# Patient Record
Sex: Female | Born: 1990 | Race: White | Hispanic: No | Marital: Married | Smoking: Former smoker
Health system: Southern US, Community
[De-identification: ages and names within clinical notes are randomized; demographics above are authoritative.]

---
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
## 2014-10-03 HISTORY — PX: TUBAL LIGATION: SHX77

## 2019-10-08 DIAGNOSIS — M542 Cervicalgia: Secondary | ICD-10-CM

## 2019-10-08 DIAGNOSIS — F329 Major depressive disorder, single episode, unspecified: Secondary | ICD-10-CM

## 2019-10-08 DIAGNOSIS — G43109 Migraine with aura, not intractable, without status migrainosus: Secondary | ICD-10-CM

## 2019-10-08 HISTORY — DX: Major depressive disorder, single episode, unspecified: F32.9

## 2019-10-08 HISTORY — DX: Cervicalgia: M54.2

## 2019-10-08 HISTORY — DX: Migraine with aura, not intractable, without status migrainosus: G43.109

## 2019-11-05 ENCOUNTER — Encounter: Payer: Self-pay | Admitting: Nurse Practitioner

## 2019-11-05 ENCOUNTER — Ambulatory Visit: Payer: 59 | Admitting: Nurse Practitioner

## 2019-11-05 ENCOUNTER — Other Ambulatory Visit: Payer: Self-pay

## 2019-11-05 DIAGNOSIS — G43009 Migraine without aura, not intractable, without status migrainosus: Secondary | ICD-10-CM | POA: Diagnosis not present

## 2019-11-05 DIAGNOSIS — F339 Major depressive disorder, recurrent, unspecified: Secondary | ICD-10-CM

## 2019-11-05 DIAGNOSIS — Z0000 Encounter for general adult medical examination without abnormal findings: Secondary | ICD-10-CM | POA: Diagnosis not present

## 2019-11-05 MED ORDER — CITALOPRAM HYDROBROMIDE 20 MG PO TABS: 20.0000 mg | ORAL_TABLET | Freq: Every day | ORAL | 0 refills | Status: DC

## 2019-11-05 MED ORDER — CITALOPRAM HYDROBROMIDE 20 MG PO TABS: 20.0000 mg | ORAL_TABLET | Freq: Every day | ORAL | 3 refills | Status: DC

## 2019-11-05 NOTE — Patient Instructions (Addendum)
Patient is to return in 3 month for f/u for increase in citalopram   Preventive Care 7-29 Years Old, Female Preventive care refers to visits with your health care provider and lifestyle choices that can promote health and wellness. This includes:  A yearly physical exam. This may also be called an annual well check.  Regular dental visits and eye exams.  Immunizations.  Screening for certain conditions.  Healthy lifestyle choices, such as eating a healthy diet, getting regular exercise, not using drugs or products that contain nicotine and tobacco, and limiting alcohol use. What can I expect for my preventive care visit? Physical exam Your health care provider will check your:  Height and weight. This may be used to calculate body mass index (BMI), which tells if you are at a healthy weight.  Heart rate and blood pressure.  Skin for abnormal spots. Counseling Your health care provider may ask you questions about your:  Alcohol, tobacco, and drug use.  Emotional well-being.  Home and relationship well-being.  Sexual activity.  Eating habits.  Work and work Statistician.  Method of birth control.  Menstrual cycle.  Pregnancy history. What immunizations do I need?  Influenza (flu) vaccine  This is recommended every year. Tetanus, diphtheria, and pertussis (Tdap) vaccine  You may need a Td booster every 10 years. Varicella (chickenpox) vaccine  You may need this if you have not been vaccinated. Human papillomavirus (HPV) vaccine  If recommended by your health care provider, you may need three doses over 6 months. Measles, mumps, and rubella (MMR) vaccine  You may need at least one dose of MMR. You may also need a second dose. Meningococcal conjugate (MenACWY) vaccine  One dose is recommended if you are age 76-21 years and a first-year college student living in a residence hall, or if you have one of several medical conditions. You may also need additional  booster doses. Pneumococcal conjugate (PCV13) vaccine  You may need this if you have certain conditions and were not previously vaccinated. Pneumococcal polysaccharide (PPSV23) vaccine  You may need one or two doses if you smoke cigarettes or if you have certain conditions. Hepatitis A vaccine  You may need this if you have certain conditions or if you travel or work in places where you may be exposed to hepatitis A. Hepatitis B vaccine  You may need this if you have certain conditions or if you travel or work in places where you may be exposed to hepatitis B. Haemophilus influenzae type b (Hib) vaccine  You may need this if you have certain conditions. You may receive vaccines as individual doses or as more than one vaccine together in one shot (combination vaccines). Talk with your health care provider about the risks and benefits of combination vaccines. What tests do I need?  Blood tests  Lipid and cholesterol levels. These may be checked every 5 years starting at age 90.  Hepatitis C test.  Hepatitis B test. Screening  Diabetes screening. This is done by checking your blood sugar (glucose) after you have not eaten for a while (fasting).  Sexually transmitted disease (STD) testing.  BRCA-related cancer screening. This may be done if you have a family history of breast, ovarian, tubal, or peritoneal cancers.  Pelvic exam and Pap test. This may be done every 3 years starting at age 26. Starting at age 76, this may be done every 5 years if you have a Pap test in combination with an HPV test. Talk with your health care  provider about your test results, treatment options, and if necessary, the need for more tests. Follow these instructions at home: Eating and drinking   Eat a diet that includes fresh fruits and vegetables, whole grains, lean protein, and low-fat dairy.  Take vitamin and mineral supplements as recommended by your health care provider.  Do not drink alcohol  if: ? Your health care provider tells you not to drink. ? You are pregnant, may be pregnant, or are planning to become pregnant.  If you drink alcohol: ? Limit how much you have to 0-1 drink a day. ? Be aware of how much alcohol is in your drink. In the [place] one drink equals one 12 oz bottle of beer (355 mL), one 5 oz glass of wine (148 mL), or one 1 oz glass of hard liquor (44 mL). Lifestyle  Take daily care of your teeth and gums.  Stay active. Exercise for at least 30 minutes on 5 or more days each week.  Do not use any products that contain nicotine or tobacco, such as cigarettes, e-cigarettes, and chewing tobacco. If you need help quitting, ask your health care provider.  If you are sexually active, practice safe sex. Use a condom or other form of birth control (contraception) in order to prevent pregnancy and STIs (sexually transmitted infections). If you plan to become pregnant, see your health care provider for a preconception visit. What's next?  Visit your health care provider once a year for a well check visit.  Ask your health care provider how often you should have your eyes and teeth checked.  Stay up to date on all vaccines. This information is not intended to replace advice given to you by your health care provider. Make sure you discuss any questions you have with your health care provider. Document Revised: 05/31/2018 Document Reviewed: 05/31/2018 Elsevier Patient Education  2020 [employer].

## 2019-11-06 ENCOUNTER — Other Ambulatory Visit (INDEPENDENT_AMBULATORY_CARE_PROVIDER_SITE_OTHER): Payer: 59 | Admitting: Nurse Practitioner

## 2019-11-06 ENCOUNTER — Telehealth: Payer: Self-pay

## 2019-11-06 DIAGNOSIS — D229 Melanocytic nevi, unspecified: Secondary | ICD-10-CM

## 2019-11-06 LAB — CBC WITH DIFFERENTIAL/PLATELET
Basophils Absolute: 0.1 10*3/uL (ref 0.0–0.2)
Basos: 1 %
EOS (ABSOLUTE): 0.1 10*3/uL (ref 0.0–0.4)
Eos: 1 %
Hematocrit: 38.2 % (ref 34.0–46.6)
Hemoglobin: 12.9 g/dL (ref 11.1–15.9)
Immature Grans (Abs): 0 10*3/uL (ref 0.0–0.1)
Immature Granulocytes: 0 %
Lymphocytes Absolute: 2.6 10*3/uL (ref 0.7–3.1)
Lymphs: 28 %
MCH: 28.5 pg (ref 26.6–33.0)
MCHC: 33.8 g/dL (ref 31.5–35.7)
MCV: 85 fL (ref 79–97)
Monocytes Absolute: 0.5 10*3/uL (ref 0.1–0.9)
Monocytes: 5 %
Neutrophils Absolute: 6.1 10*3/uL (ref 1.4–7.0)
Neutrophils: 65 %
Platelets: 262 10*3/uL (ref 150–450)
RBC: 4.52 x10E6/uL (ref 3.77–5.28)
RDW: 12.7 % (ref 11.7–15.4)
WBC: 9.4 10*3/uL (ref 3.4–10.8)

## 2019-11-06 LAB — COMPREHENSIVE METABOLIC PANEL
ALT: 15 IU/L (ref 0–32)
AST: 20 IU/L (ref 0–40)
Albumin/Globulin Ratio: 1.6 (ref 1.2–2.2)
Albumin: 4.7 g/dL (ref 3.9–5.0)
Alkaline Phosphatase: 76 IU/L (ref 39–117)
BUN/Creatinine Ratio: 14 (ref 9–23)
BUN: 13 mg/dL (ref 6–20)
Bilirubin Total: 0.4 mg/dL (ref 0.0–1.2)
CO2: 21 mmol/L (ref 20–29)
Calcium: 9.7 mg/dL (ref 8.7–10.2)
Chloride: 108 mmol/L — ABNORMAL HIGH (ref 96–106)
Creatinine, Ser: 0.96 mg/dL (ref 0.57–1.00)
GFR calc Af Amer: 93 mL/min/{1.73_m2} (ref >59)
GFR calc non Af Amer: 81 mL/min/{1.73_m2} (ref >59)
Globulin, Total: 2.9 g/dL (ref 1.5–4.5)
Glucose: 86 mg/dL (ref 65–99)
Potassium: 4.2 mmol/L (ref 3.5–5.2)
Sodium: 142 mmol/L (ref 134–144)
Total Protein: 7.6 g/dL (ref 6.0–8.5)

## 2019-11-06 LAB — LIPID PANEL
Chol/HDL Ratio: 4.9 ratio — ABNORMAL HIGH (ref 0.0–4.4)
Cholesterol, Total: 228 mg/dL — ABNORMAL HIGH (ref 100–199)
HDL: 47 mg/dL (ref >39)
LDL Chol Calc (NIH): 162 mg/dL — ABNORMAL HIGH (ref 0–99)
Triglycerides: 107 mg/dL (ref 0–149)
VLDL Cholesterol Cal: 19 mg/dL (ref 5–40)

## 2019-11-06 LAB — CARDIOVASCULAR RISK ASSESSMENT

## 2019-11-06 NOTE — Progress Notes (Deleted)
Referral to dermatology.  

## 2019-11-06 NOTE — Progress Notes (Addendum)
Established Patient Office Visit  Subjective:  Patient ID: Melanie Walter, female    DOB: Oct 01, 1991  Age: 29 y.o. MRN: [id number]  CC:  Chief Complaint  Patient presents with  . Annual Exam    HPI Melanie Walter presents for an encounter for general adult medical examination without abnormal findings. Her last physical exam was 2016 she has never had formal vision screening and reports no changes in vision. She performs self breast exams occasionally. SHe is current with her immunization. Tdap, refuse influenza,    Smoking Sts: occasional smoker (Marijuana) used for jaw pain per patient after wisdom tooth extraction. .   Encounter for general adult medical examination without abnormal findings  Physical: Patient's last physical exam was 2016 Weight: Appropriate for height (BMI less than 27%) ;  Blood Pressure: Normal (BP less than 130/80) ;  Medical History: Patient history reviewed ; Family history reviewed ;  Allergies Reviewed: No change in current allergies ;  Medications Reviewed: Medications reviewed - no changes ;  Lipids: abnormal lipid levels ;  Smoking: Life-long non-smoker ; Marijuana  Physical Activity: Exercises at least 2-3 times per week ;  Alcohol/Drug Use: 1-2 drinks per week ; Ma Patient is not afflicted from Stress Incontinence and Urge Incontinence  Safety: reviewed ; Patient wears a seat belt, has smoke detectors, has carbon monoxide detectors and wears sunscreen with extended sun exposure. Dental Care: biannual cleanings, brushes and flosses daily. Ophthalmology/Optometry: Annual visit.  Hearing loss: none Vision impairments: none  Past Medical History:  Diagnosis Date  . Cervicalgia 10/08/2019  . Major depression, single episode 10/08/2019  . Migraine with aura and without status migrainosus, not intractable 10/08/2019    Past Surgical History:  Procedure Laterality Date  . TUBAL LIGATION Bilateral 2016    Family History  Problem Relation Age  of Onset  . Heart failure Mother   . Hypertension Mother   . Asthma Mother   . COPD Mother   . Transient ischemic attack Mother   . Diabetes Mellitus II Mother   . Hypertension Father   . Diabetes Mellitus II Father   . Heart attack Father     Social History   Socioeconomic History  . Marital status: Married    Spouse name: Not on file  . Number of children: 3  . Years of education: Not on file  . Highest education level: Not on file  Occupational History  . Occupation: Un-Employed  Tobacco Use  . Smoking status: Former Smoker    Quit date: 06/2019    Years since quitting: 0.5  . Smokeless tobacco: Never Used  Substance and Sexual Activity  . Alcohol use: Yes    Comment: 1-2 mixed drinks a week   . Drug use: Never  . Sexual activity: Not on file  Other Topics Concern  . Not on file  Social History Narrative  . Not on file   Social Determinants of Health   Financial Resource Strain:   . Difficulty of Paying Living Expenses: Not on file  Food Insecurity:   . Worried About Charity fundraiser in the Last Year: Not on file  . Ran Out of Food in the Last Year: Not on file  Transportation Needs:   . Lack of Transportation (Medical): Not on file  . Lack of Transportation (Non-Medical): Not on file  Physical Activity:   . Days of Exercise per Week: Not on file  . Minutes of Exercise per Session: Not on file  Stress:   .  Feeling of Stress : Not on file  Social Connections:   . Frequency of Communication with Friends and Family: Not on file  . Frequency of Social Gatherings with Friends and Family: Not on file  . Attends Religious Services: Not on file  . Active Member of Clubs or Organizations: Not on file  . Attends Archivist Meetings: Not on file  . Marital Status: Not on file  Intimate Partner Violence:   . Fear of Current or Ex-Partner: Not on file  . Emotionally Abused: Not on file  . Physically Abused: Not on file  . Sexually Abused: Not on file     Outpatient Medications Prior to Visit  Medication Sig Dispense Refill  . topiramate (TOPAMAX) 25 MG tablet Take 25 mg by mouth 2 (two) times daily. Take 1 tablet for first week then 2nd week take 2 tablets to be at maintenance dose.    . citalopram (CELEXA) 10 MG tablet Take 10 mg by mouth daily. Take 1 tablet daily     No facility-administered medications prior to visit.    Allergies  Allergen Reactions  . Miconazole     ROS Review of Systems  Constitutional: Negative for activity change, appetite change and fatigue.  HENT: Negative for congestion, ear discharge, ear pain, facial swelling, hearing loss, mouth sores, nosebleeds, sinus pain and voice change.   Eyes: Negative for pain and discharge.  Respiratory: Negative for cough and shortness of breath.   Cardiovascular: Negative for chest pain, palpitations and leg swelling.  Gastrointestinal: Negative for abdominal distention, abdominal pain, constipation and nausea.  Endocrine: Negative for cold intolerance and heat intolerance.  Genitourinary: Negative for difficulty urinating.  Neurological: Negative for facial asymmetry and weakness.  Psychiatric/Behavioral: Negative for agitation. The patient is not nervous/anxious.        Objective:    Physical Exam  Constitutional: She appears well-developed. No distress.  HENT:  Head: Normocephalic.  Right Ear: External ear normal.  Left Ear: External ear normal.  Eyes: Pupils are equal, round, and reactive to light. Conjunctivae and EOM are normal. Right eye exhibits no discharge.  Cardiovascular: Regular rhythm, normal heart sounds and intact distal pulses.  Pulmonary/Chest: Effort normal. She exhibits no tenderness.  Abdominal: Soft. Bowel sounds are normal. She exhibits no distension. There is no abdominal tenderness.  Genitourinary:    Vagina normal.   Musculoskeletal:        General: No edema. Normal range of motion.     Cervical back: Normal range of motion and  neck supple.  Neurological: She is alert.  Skin: No rash noted.  Psychiatric: She has a normal mood and affect.       There were no vitals taken for this visit. Wt Readings from Last 3 Encounters:  10/16/19 168 lb (76.2 kg)     Health Maintenance Due  Topic Date Due  . HIV Screening  03/04/2006  . TETANUS/TDAP  03/04/2010  . PAP-Cervical Cytology Screening  03/04/2012  . INFLUENZA VACCINE  05/04/2019    There are no preventive care reminders to display for this patient.  No results found for: TSH Lab Results  Component Value Date   WBC 9.4 11/05/2019   HGB 12.9 11/05/2019   HCT 38.2 11/05/2019   MCV 85 11/05/2019   PLT 262 11/05/2019   Lab Results  Component Value Date   NA 142 11/05/2019   K 4.2 11/05/2019   CO2 21 11/05/2019   GLUCOSE 86 11/05/2019   BUN 13 11/05/2019  CREATININE 0.96 11/05/2019   BILITOT 0.4 11/05/2019   ALKPHOS 76 11/05/2019   AST 20 11/05/2019   ALT 15 11/05/2019   PROT 7.6 11/05/2019   ALBUMIN 4.7 11/05/2019   CALCIUM 9.7 11/05/2019   Lab Results  Component Value Date   CHOL 228 (H) 11/05/2019   Lab Results  Component Value Date   HDL 47 11/05/2019   Lab Results  Component Value Date   LDLCALC 162 (H) 11/05/2019   Lab Results  Component Value Date   TRIG 107 11/05/2019   Lab Results  Component Value Date   CHOLHDL 4.9 (H) 11/05/2019   No results found for: HGBA1C    Assessment & Plan:   Problem List Items Addressed This Visit      Cardiovascular and Mediastinum   Migraine without aura and without status migrainosus, not intractable    Well controlled.  No changes to medicines.  Continue to work on eating a healthy diet and exercise.       Relevant Medications   citalopram (CELEXA) 20 MG tablet     Other   Depression, recurrent ([place])    Not well controlled.   changes to medication   Continue to work on eating a healthy diet and exercise.  Labs drawn today.       Relevant Medications   citalopram  (CELEXA) 20 MG tablet   Annual physical exam - Primary    Physical exam completed,   Continue to work on eating a healthy diet and exercise.  Labs drawn today.       Relevant Orders   CBC with Differential/Platelet (Completed)   Comprehensive metabolic panel (Completed)   Lipid Panel (Completed)   PapIG, HPV, rfx 16/18   Cardiovascular Risk Assessment (Completed)   Gyn Report (Completed)   Specimen status report (Completed)      Meds ordered this encounter  Medications  . DISCONTD: citalopram (CELEXA) 20 MG tablet    Sig: Take 1 tablet (20 mg total) by mouth daily.    Dispense:  30 tablet    Refill:  3    Order Specific Question:   Supervising Provider    AnswerRochel Brome [id number]  . citalopram (CELEXA) 20 MG tablet    Sig: Take 1 tablet (20 mg total) by mouth daily.    Dispense:  30 tablet    Refill:  0    Order Specific Question:   Supervising Provider    AnswerShelton Silvas    Follow-up: Return in about 3 months (around 02/02/2020).    Ivy Lynn, NP

## 2019-11-06 NOTE — Telephone Encounter (Signed)
Patient was notified.

## 2019-11-08 LAB — GYN REPORT: PAP Smear Comment: 0

## 2019-11-08 LAB — SPECIMEN STATUS REPORT

## 2019-11-08 NOTE — Progress Notes (Signed)
Need to repeat PAP no charge, CBC all normal, kidney nd liver tests normal, LDL cholesterol is high and needs to start a low cholesterol diet- can send, lp

## 2019-12-03 ENCOUNTER — Ambulatory Visit: Payer: 59 | Admitting: Nurse Practitioner

## 2019-12-08 ENCOUNTER — Encounter: Payer: Self-pay | Admitting: Nurse Practitioner

## 2019-12-08 DIAGNOSIS — G43009 Migraine without aura, not intractable, without status migrainosus: Secondary | ICD-10-CM | POA: Insufficient documentation

## 2019-12-08 DIAGNOSIS — Z0000 Encounter for general adult medical examination without abnormal findings: Secondary | ICD-10-CM | POA: Insufficient documentation

## 2019-12-08 DIAGNOSIS — F339 Major depressive disorder, recurrent, unspecified: Secondary | ICD-10-CM | POA: Insufficient documentation

## 2019-12-08 NOTE — Assessment & Plan Note (Signed)
Well controlled. No changes to medicines.  Continue to work on eating a healthy diet and exercise.    

## 2019-12-08 NOTE — Assessment & Plan Note (Signed)
Physical exam completed,   Continue to work on eating a healthy diet and exercise.  Labs drawn today.

## 2019-12-08 NOTE — Assessment & Plan Note (Signed)
Not well controlled.   changes to medication   Continue to work on eating a healthy diet and exercise.  Labs drawn today.

## 2020-01-01 DIAGNOSIS — D229 Melanocytic nevi, unspecified: Secondary | ICD-10-CM | POA: Insufficient documentation

## 2020-01-21 NOTE — Progress Notes (Signed)
No charge for referral.

## 2020-07-07 ENCOUNTER — Other Ambulatory Visit (HOSPITAL_COMMUNITY): Payer: Self-pay | Admitting: Internal Medicine

## 2020-07-07 DIAGNOSIS — Z1231 Encounter for screening mammogram for malignant neoplasm of breast: Secondary | ICD-10-CM

## 2020-07-15 ENCOUNTER — Ambulatory Visit (HOSPITAL_COMMUNITY)
Admission: RE | Admit: 2020-07-15 | Discharge: 2020-07-15 | Disposition: A | Discharge status: Home / Self Care | Payer: 59 | Source: Ambulatory Visit | Attending: Internal Medicine | Admitting: Internal Medicine

## 2020-07-15 ENCOUNTER — Other Ambulatory Visit: Payer: Self-pay

## 2020-07-15 ENCOUNTER — Encounter (HOSPITAL_COMMUNITY): Payer: Self-pay

## 2020-07-15 DIAGNOSIS — Z1231 Encounter for screening mammogram for malignant neoplasm of breast: Secondary | ICD-10-CM | POA: Insufficient documentation

## 2020-07-20 ENCOUNTER — Other Ambulatory Visit (HOSPITAL_COMMUNITY): Payer: Self-pay | Admitting: Internal Medicine

## 2020-07-20 DIAGNOSIS — R928 Other abnormal and inconclusive findings on diagnostic imaging of breast: Secondary | ICD-10-CM

## 2020-07-28 ENCOUNTER — Other Ambulatory Visit: Payer: Self-pay

## 2020-07-28 ENCOUNTER — Ambulatory Visit (HOSPITAL_COMMUNITY)
Admission: RE | Admit: 2020-07-28 | Discharge: 2020-07-28 | Disposition: A | Discharge status: Home / Self Care | Payer: 59 | Source: Ambulatory Visit | Attending: Internal Medicine | Admitting: Internal Medicine

## 2020-07-28 DIAGNOSIS — R928 Other abnormal and inconclusive findings on diagnostic imaging of breast: Secondary | ICD-10-CM | POA: Diagnosis not present

## 2022-05-08 IMAGING — MG DIGITAL SCREENING BILAT W/ TOMO W/ CAD
8 series · 9 of 24 positions shown · non-contrast
Comparison: None.

CLINICAL DATA: Screening.

EXAM:
DIGITAL SCREENING BILATERAL MAMMOGRAM WITH TOMO AND CAD

[L MLO synth-2D]
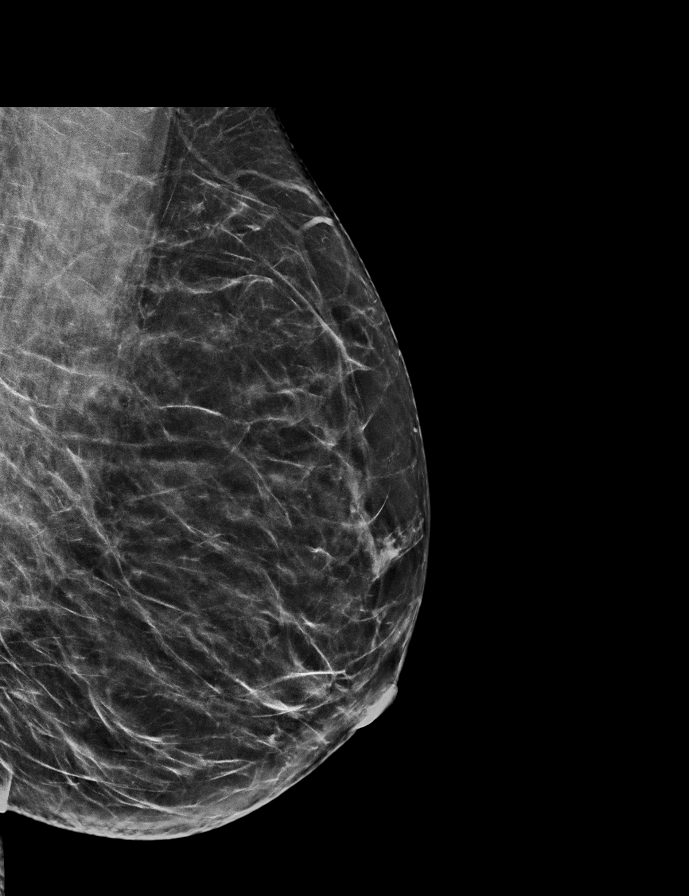

[R CC synth-2D]
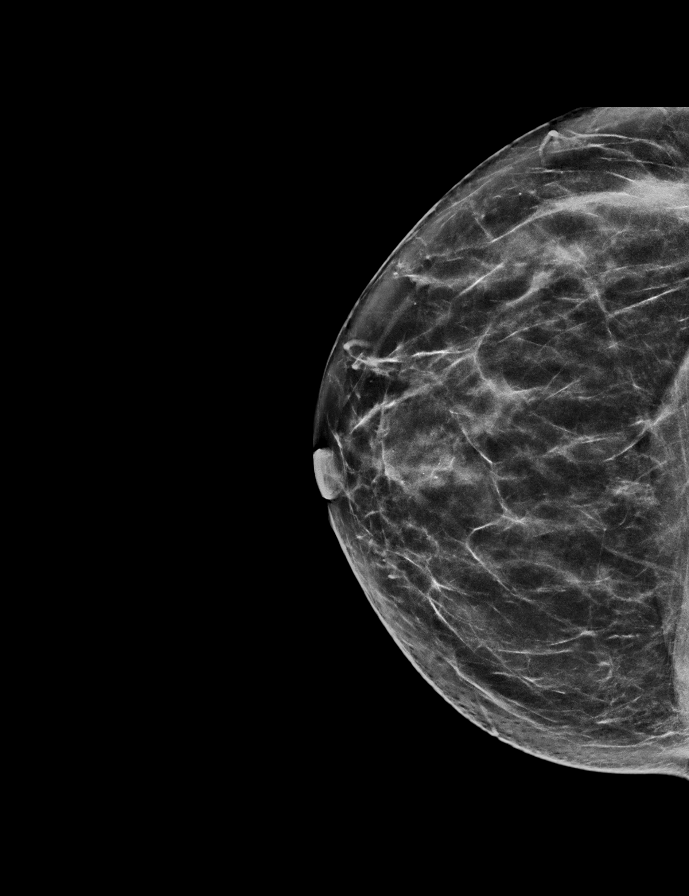

[L CC synth-2D]
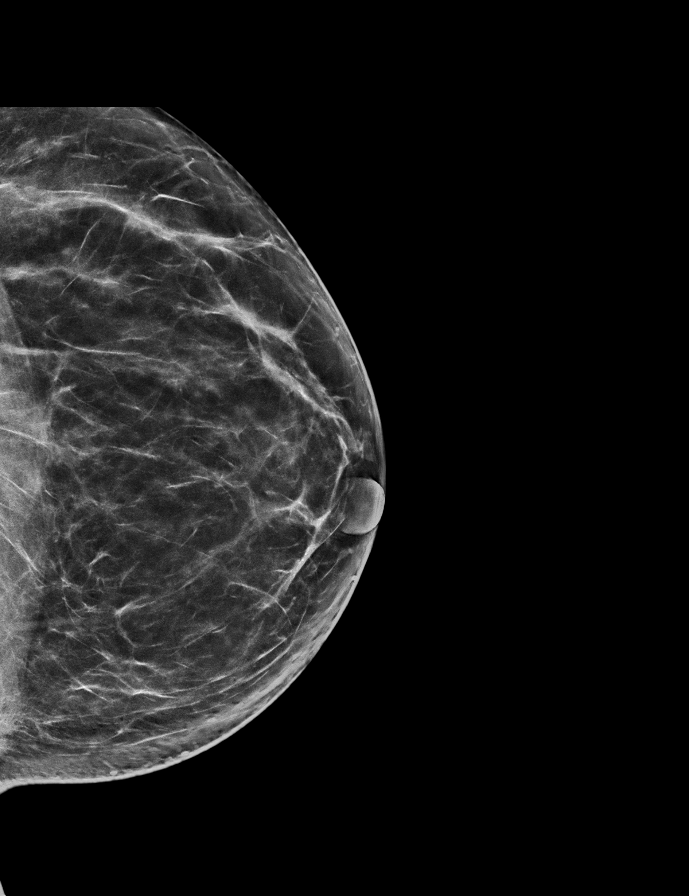

[R MLO synth-2D]
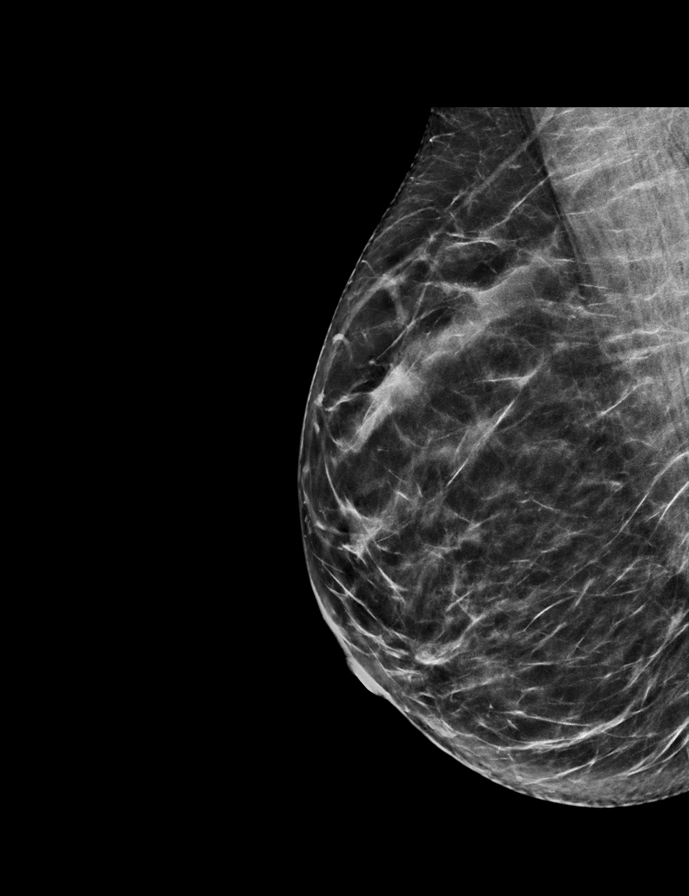

[R MLO tomo · 2 of 65 frames shown]
[frame 21/65]
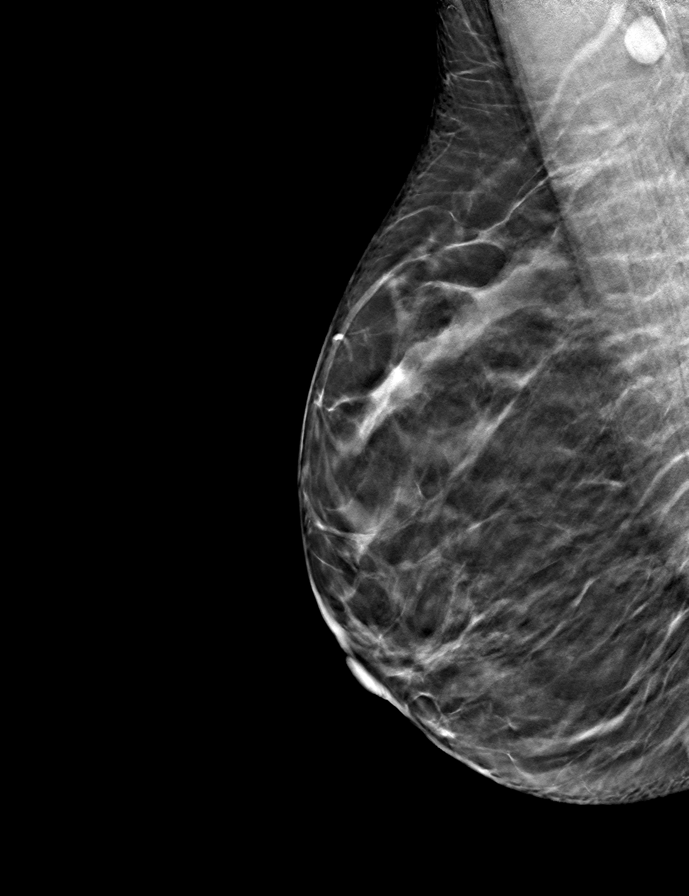
[frame 33/65]
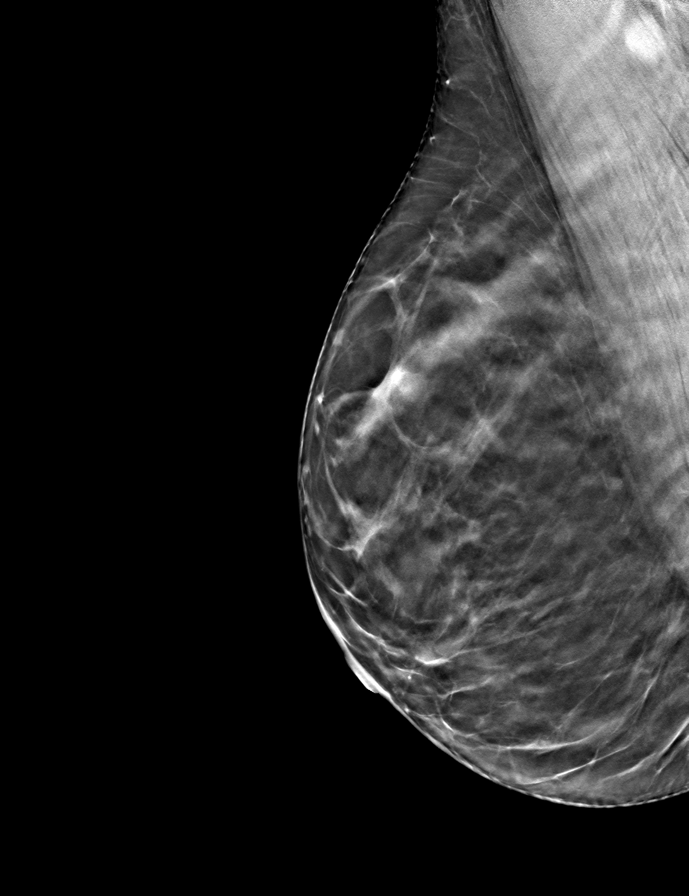

[R CC tomo · tomo slice 33/66.0]
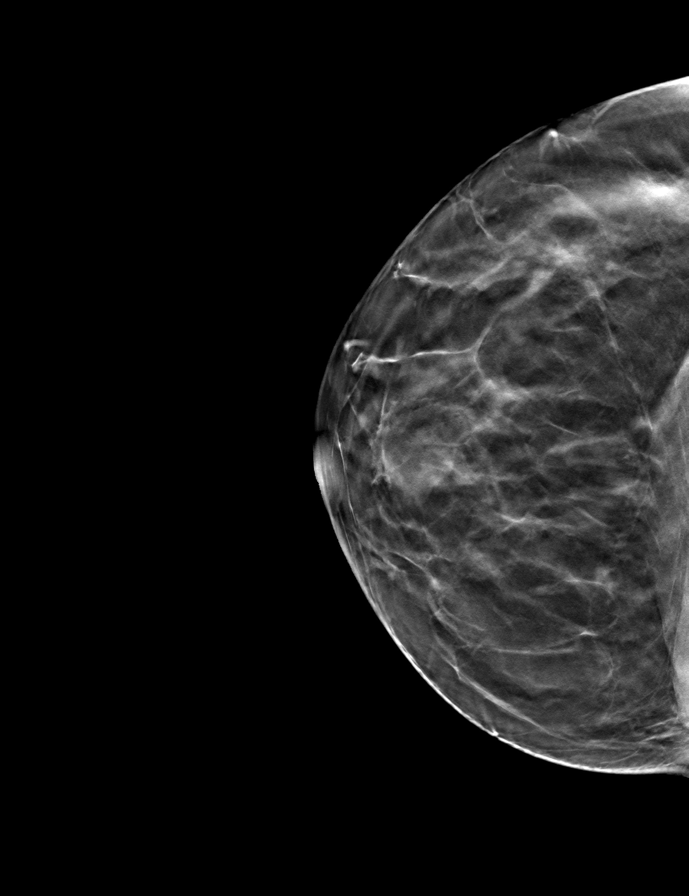

[L CC tomo · tomo slice 34/67.0]
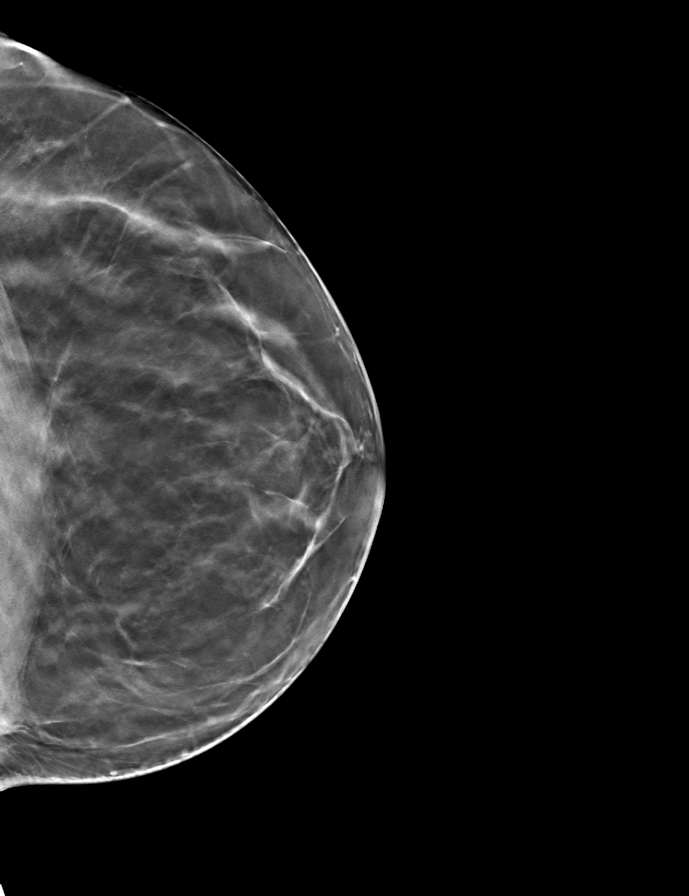

[L MLO tomo · tomo slice 32/63.0]
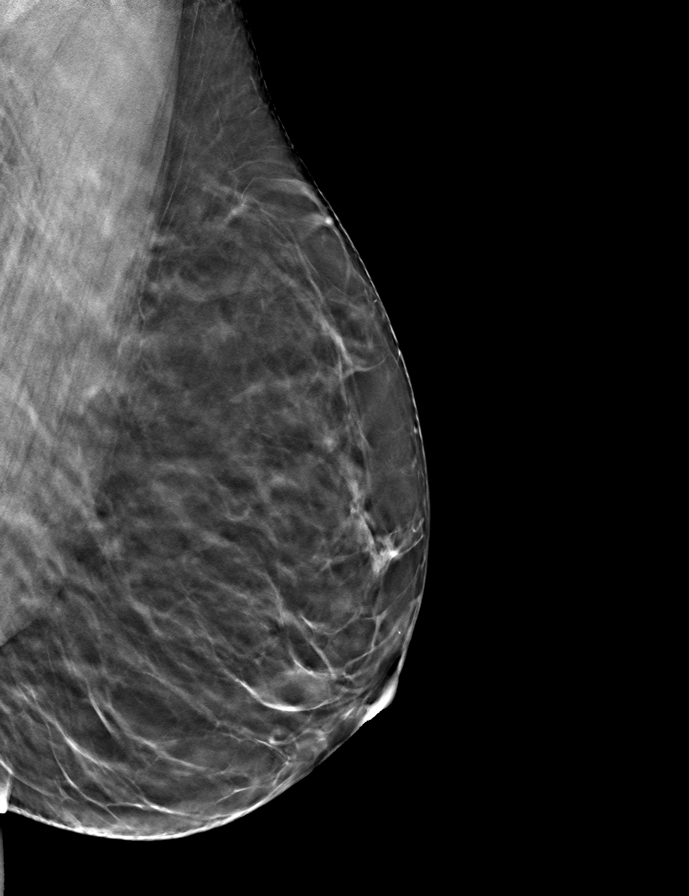

[9 of 24 positions shown; findings below may reference images not displayed]

ACR Breast Density Category b: There are scattered areas of
fibroglandular density.
FINDINGS: In the right axilla, a possible mass warrants further evaluation. In
the left breast, no findings suspicious for malignancy.

Images were processed with CAD.
IMPRESSION: Further evaluation is suggested for possible mass in the right
axilla.

RECOMMENDATION:
Ultrasound of the right axilla. (Code:[id number])

The patient will be contacted regarding the findings, and additional
imaging will be scheduled.

BI-RADS CATEGORY  0: Incomplete. Need additional imaging evaluation
and/or prior mammograms for comparison.

## 2022-05-21 IMAGING — US A
1 series · 11 of 11 positions shown · non-contrast
Comparison: Bilateral screening mammogram dated 07/16/2019

CLINICAL DATA: Strong family history of breast cancer. The patient
her mother was diagnosed at age 9 or 10. She was also diagnosed with
postmenopausal breast cancer and is 62 years old now. Possible mass
in the right axilla at recent screening mammography. The patient had
2 CXBA0-I4 vaccines in left arm, the last in March 2020.

EXAM:
ULTRASOUND OF THE RIGHT AXILLA

[Series 1: a · 0.08mm/px · 11 of 11 slices shown]
[im 1/11]
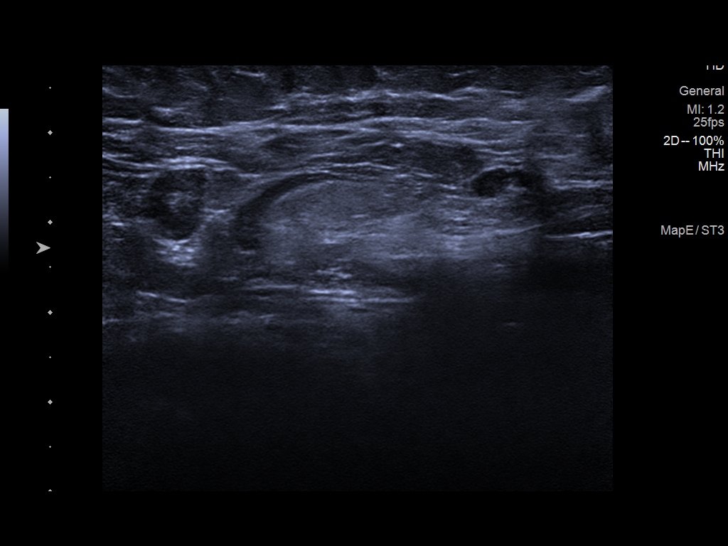
[im 2/11]
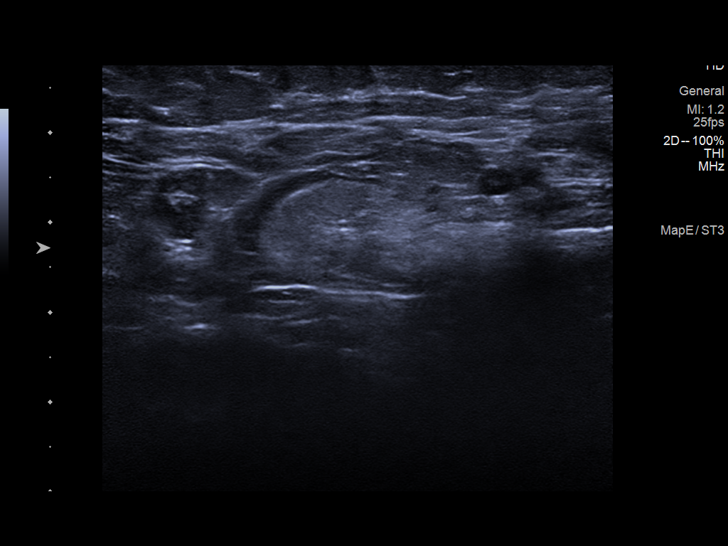
[im 3/11]
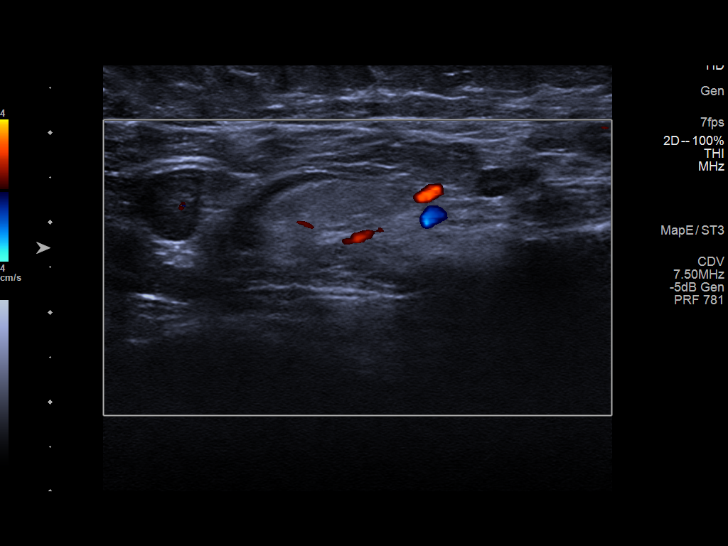
[im 4/11]
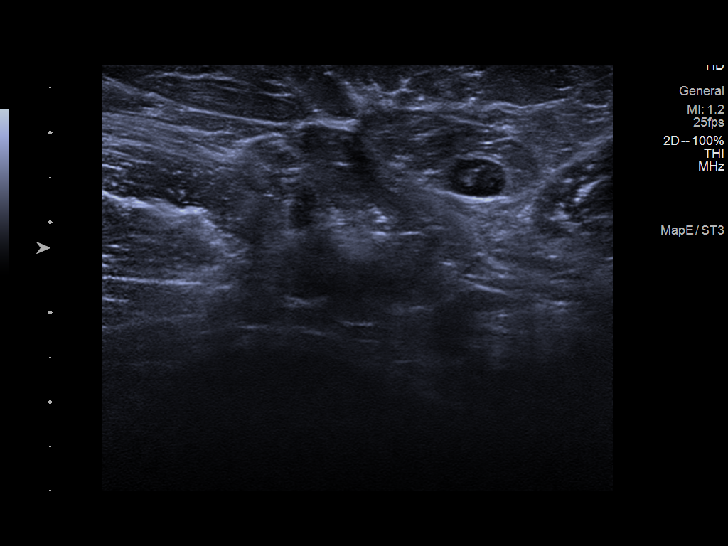
[im 5/11]
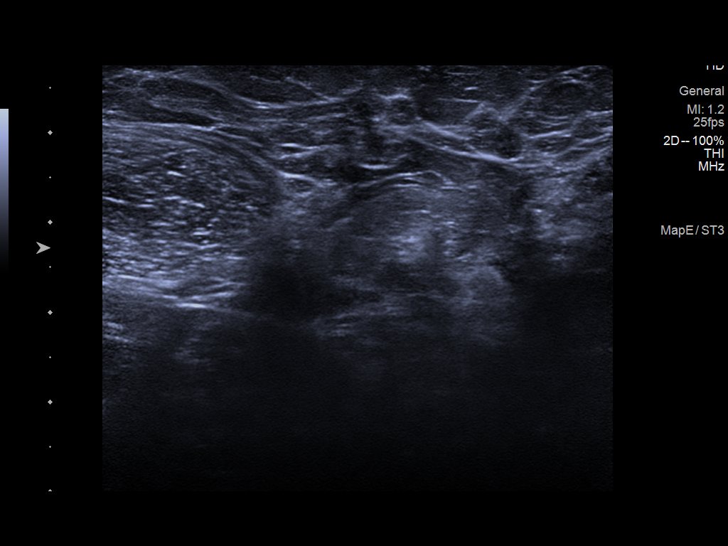
[im 6/11]
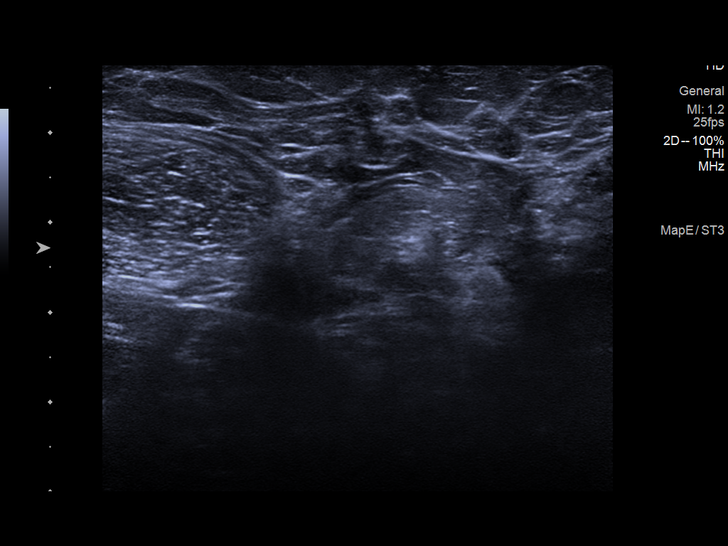
[im 7/11]
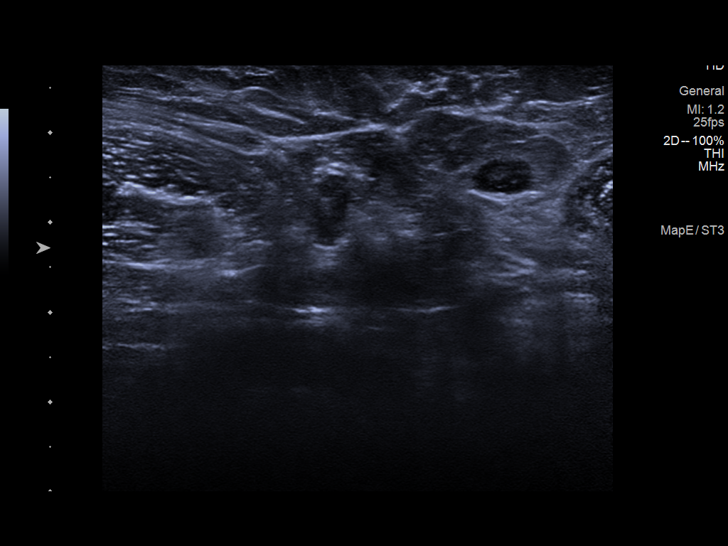
[im 8/11]
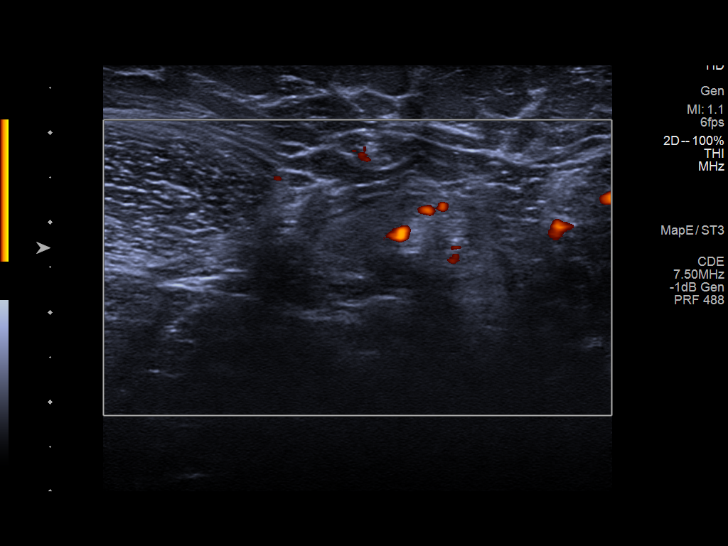
[im 9/11]
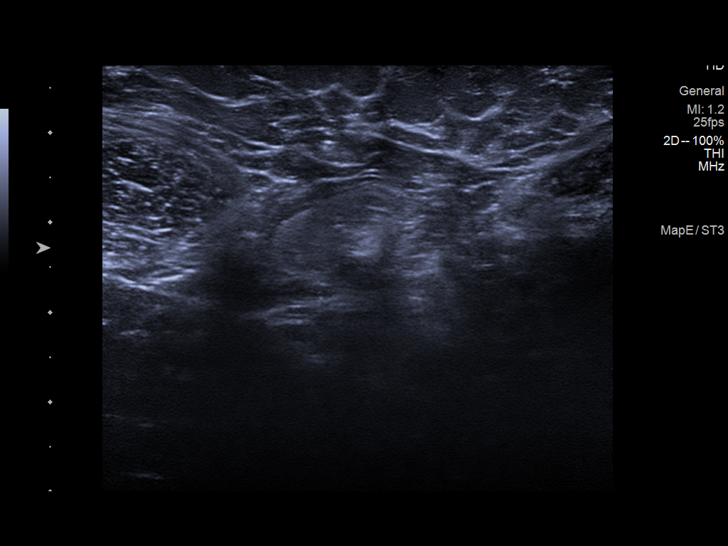
[im 10/11]
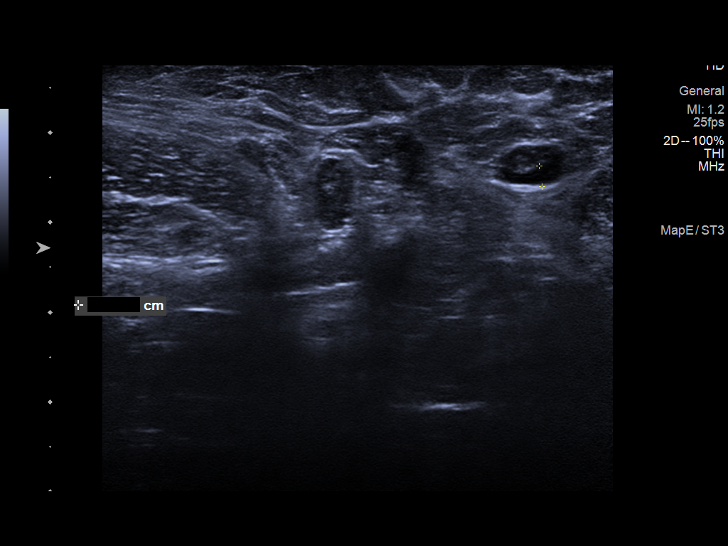
[im 11/11]
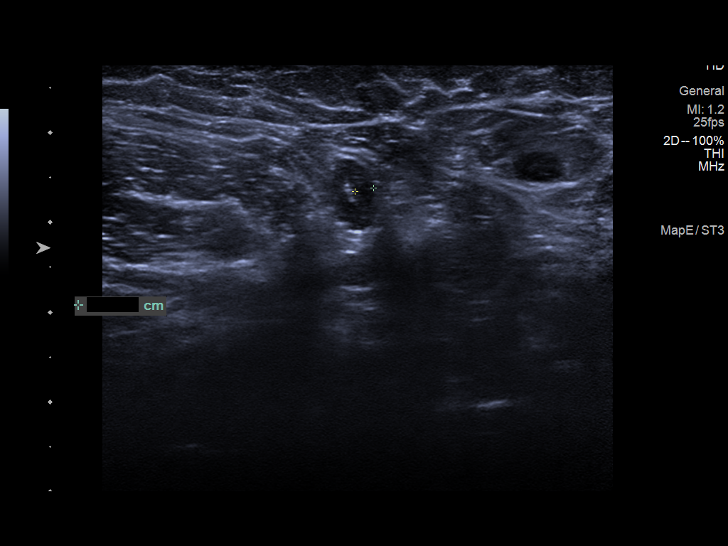

[11 of 11 positions shown; findings below may reference images not displayed]

FINDINGS: On physical exam, there are no palpable right axillary masses or
lymph nodes.

Targeted ultrasound is performed, showing normal appearing right
axillary lymph nodes. No enlarged lymph nodes or lymph nodes with
abnormal cortical thickening are demonstrated. No mass seen.
IMPRESSION: No evidence of malignancy.  No right axillary adenopathy.

RECOMMENDATION:
1. Annual screening mammography, with the next bilateral screening
mammogram recommended in 1 year.
2. Per American Cancer Society guidelines, if the patient has a
calculated lifetime risk of developing breast cancer of greater than
20%, annual screening MRI of the breasts would also be recommended.

I have discussed the findings and recommendations with the patient.
If applicable, a reminder letter will be sent to the patient
regarding the next appointment.

BI-RADS CATEGORY  1: Negative.

## 2023-12-01 ENCOUNTER — Ambulatory Visit: Payer: 59 | Admitting: Internal Medicine

## 2023-12-01 ENCOUNTER — Encounter: Payer: Self-pay | Admitting: Internal Medicine

## 2023-12-01 VITALS — BP 144/90 | HR 98 | Ht 63.0 in | Wt 166.6 lb

## 2023-12-01 DIAGNOSIS — R058 Other specified cough: Secondary | ICD-10-CM | POA: Diagnosis not present

## 2023-12-01 MED ORDER — PREDNISONE 10 MG PO TABS: ORAL_TABLET | ORAL | 0 refills | Status: AC

## 2023-12-01 MED ORDER — BENZONATATE 200 MG PO CAPS: 200.0000 mg | ORAL_CAPSULE | Freq: Three times a day (TID) | ORAL | 1 refills | Status: AC | PRN

## 2023-12-01 MED ORDER — PANTOPRAZOLE SODIUM 40 MG PO TBEC: 40.0000 mg | DELAYED_RELEASE_TABLET | Freq: Every day | ORAL | 2 refills | Status: AC

## 2023-12-01 MED ORDER — FAMOTIDINE 20 MG PO TABS: ORAL_TABLET | ORAL | 11 refills | Status: AC

## 2023-12-01 NOTE — Assessment & Plan Note (Signed)
 Onset early Nov 2014  - cyclical cough rx with max gerd, tessalon and pred x 6 d along with hs 1st gen H1 blockers per guidelines   - Allergy screen 12/01/2023 >  Eos 0. /  IgE pending   Of the three most common causes of  Sub-acute / recurrent or chronic cough, only one (GERD)  can actually contribute to/ trigger  the other two (asthma and post nasal drip syndrome)  and perpetuate the cylce of cough.  While not intuitively obvious, many patients with chronic low grade reflux do not cough until there is a primary insult that disturbs the protective epithelial barrier and exposes sensitive nerve endings.   This is typically viral but can due to PNDS and  either may apply here.    >>>  The point is that once this occurs, it is difficult to eliminate the cycle  using anything but a maximally effective acid suppression regimen at least in the short run, accompanied by an appropriate diet to address non acid GERD and control / eliminate the cough itself to the extent possible with delsym/tessalon 200 >>> also added 6 day taper off  Prednisone starting at 40 mg per day in case of component of Th-2 driven upper or lower airways inflammation (if cough responds short term only to relapse before return while will on full rx for uacs (as above), then that would point to allergic rhinitis/ asthma or eos bronchitis as alternative dx)    F/u 2 weeks unless 100% better.   Discussed in detail all the  indications, usual  risks and alternatives  relative to the benefits with patient who agrees to proceed with Rx as outlined.             Each maintenance medication was reviewed in detail including emphasizing most importantly the difference between maintenance and prns and under what circumstances the prns are to be triggered using an action plan format where appropriate.  Total time for H and P, chart review, counseling,   and generating customized AVS unique to this office visit / same day charting = 48 min with  pt new to me with refractory chronic cough of ? Origin

## 2023-12-01 NOTE — Progress Notes (Signed)
 Melanie Walter, female    DOB: Aug 11, 1991    MRN: [id number]   Brief patient profile:  32   yowf  quit smoking 2020  healthy at that time  referred to pulmonary clinic in [facility]  12/01/2023 by Lorie Phenix PA  for prouctive cough since early Nov 2024 in setting of "sinus infection" rx pseudofed/ netti pot/pred/cipro / nasal spray and improved x for sensation of drip/ incessant dry cough      History of Present Illness  12/01/2023  Pulmonary/ 1st office eval/ Melanie Walter / [facility] Office  Chief Complaint  Patient presents with   Consult    cough  Dyspnea:  unless coughing  Cough: dry / all day all night non productive Sleep: poor x 2 months / bed is flat / 3 pillows SABA use: none  02: none  Cough present on waking     No obvious day to day or daytime pattern/variability or assoc excess/ purulent sputum or mucus plugs or hemoptysis or cp or chest tightness, subjective wheeze or overt sinus or hb symptoms.    Also denies any obvious fluctuation of symptoms with weather or environmental changes or other aggravating or alleviating factors except as outlined above   No unusual exposure hx or h/o childhood pna/ asthma or knowledge of premature birth.  Current Allergies, Complete Past Medical History, Past Surgical History, Family History, and Social History were reviewed in [employer] record.  ROS  The following are not active complaints unless bolded Hoarseness, sore throat, dysphagia, dental problems, itching, sneezing,  nasal congestion or discharge of excess mucus or purulent secretions, ear ache,   fever, chills, sweats, unintended wt loss or wt gain, classically pleuritic or exertional cp,  orthopnea pnd or arm/hand swelling  or leg swelling, presyncope, palpitations, abdominal pain, anorexia, nausea, vomiting, diarrhea  or change in bowel habits or change in bladder habits, change in stools or change in urine, dysuria, hematuria,  rash, arthralgias, visual  complaints, headache, numbness, weakness or ataxia or problems with walking or coordination,  change in mood or  memory.            Outpatient Medications Prior to Visit  Medication Sig Dispense Refill   rizatriptan (MAXALT) 10 MG tablet Take 1 tablet at onset of migraine symptoms. May repeat dose in 2 hours if needed. Max 20 mg per 24 hours.     valACYclovir (VALTREX) 500 MG tablet Take by mouth.     venlafaxine XR (EFFEXOR-XR) 150 MG 24 hr capsule Take 150 mg by mouth daily.     citalopram (CELEXA) 20 MG tablet Take 1 tablet (20 mg total) by mouth daily. 30 tablet 0   topiramate (TOPAMAX) 25 MG tablet Take 25 mg by mouth 2 (two) times daily. Take 1 tablet for first week then 2nd week take 2 tablets to be at maintenance dose.     No facility-administered medications prior to visit.    Past Medical History:  Diagnosis Date   Cervicalgia 10/08/2019   Major depression, single episode 10/08/2019   Migraine with aura and without status migrainosus, not intractable 10/08/2019      Objective:     BP (!) 144/90   Pulse 98   Ht 5\' 3"  (1.6 m)   Wt 166 lb 9.6 oz (75.6 kg)   SpO2 98% Comment: room air  BMI 29.51 kg/m   SpO2: 98 % (room air)  Pleasant amb wf nad/ occ throat clearing    HEENT : Oropharynx  clear/ no cobblestoning on pnd      Nasal turbinates nl    NECK :  without  apparent JVD/ palpable Nodes/TM    LUNGS: no acc muscle use,  Nl contour chest which is clear to A and P bilaterally without cough on insp or exp maneuvers   CV:  RRR  no s3 or murmur or increase in P2, and no edema   ABD:  soft and nontender   MS:  Gait nl   ext warm without deformities Or obvious joint restrictions  calf tenderness, cyanosis or clubbing    SKIN: warm and dry without lesions    NEURO:  alert, approp, nl sensorium with  no motor or cerebellar deficits apparent.   CXR PA and Lateral:   12/01/2023 :    I personally reviewed images and impression is as follows:    Did not go for  cxr as rec      Assessment   Upper airway cough syndrome Onset early Nov 2014  - cyclical cough rx with max gerd, tessalon and pred x 6 d along with hs 1st gen H1 blockers per guidelines   - Allergy screen 12/01/2023 >  Eos 0. /  IgE pending   Of the three most common causes of  Sub-acute / recurrent or chronic cough, only one (GERD)  can actually contribute to/ trigger  the other two (asthma and post nasal drip syndrome)  and perpetuate the cylce of cough.  While not intuitively obvious, many patients with chronic low grade reflux do not cough until there is a primary insult that disturbs the protective epithelial barrier and exposes sensitive nerve endings.   This is typically viral but can due to PNDS and  either may apply here.    >>>  The point is that once this occurs, it is difficult to eliminate the cycle  using anything but a maximally effective acid suppression regimen at least in the short run, accompanied by an appropriate diet to address non acid GERD and control / eliminate the cough itself to the extent possible with delsym/tessalon 200 >>> also added 6 day taper off  Prednisone starting at 40 mg per day in case of component of Th-2 driven upper or lower airways inflammation (if cough responds short term only to relapse before return while will on full rx for uacs (as above), then that would point to allergic rhinitis/ asthma or eos bronchitis as alternative dx)    F/u 2 weeks unless 100% better.   Discussed in detail all the  indications, usual  risks and alternatives  relative to the benefits with patient who agrees to proceed with Rx as outlined.             Each maintenance medication was reviewed in detail including emphasizing most importantly the difference between maintenance and prns and under what circumstances the prns are to be triggered using an action plan format where appropriate.  Total time for H and P, chart review, counseling,   and generating customized AVS  unique to this office visit / same day charting = 48 min with pt new to me with refractory chronic cough of ? Origin           Sandrea Hughs, MD 12/01/2023

## 2023-12-01 NOTE — Patient Instructions (Addendum)
 The key to effective treatment for your cough is eliminating the non-stop cycle of cough you're stuck in long enough to let your airway heal completely and then see if there is anything still making you cough once you stop the cough suppression, but this should take no more than 5 days to figure out  First take delsym two tsp every 12 hours and supplement if needed with tessalon 200 mg every 4- 6hours to suppress the urge to cough at all or even clear your throat. Swallowing water or using ice chips/non mint and menthol containing candies (such as lifesavers or sugarless jolly ranchers) are also effective.  You should rest your voice and avoid activities that you know make you cough.  Once you have eliminated the cough for 3 straight days try reducing tessalon 200 mg first,  then the delsym as tolerated.    Prednisone 10 mg take  4 each am x 2 days,   2 each am x 2 days,  1 each am x 2 days and stop (this is to eliminate allergies and inflammation from coughing)  Protonix (pantoprazole) Take 30-60 min before first meal of the day and Pepcid 20 mg one bedtime plus chlorpheniramine 4 mg x 2 at bedtime (both available over the counter)  until cough is completely gone for at least a week without the need for cough suppression  GERD (REFLUX)  is an extremely common cause of respiratory symptoms, many times with no significant heartburn at all.    It can be treated with medication, but also with lifestyle changes including avoidance of late meals, excessive alcohol, smoking cessation, and avoid fatty foods, chocolate, peppermint, colas, red wine, and acidic juices such as orange juice.  NO MINT OR MENTHOL PRODUCTS SO NO COUGH DROPS - Ludens ok  USE HARD CANDY INSTEAD (jolley ranchers or Stover's or Lifesavers (all available in sugarless versions) NO OIL BASED VITAMINS - use powdered substitutes.  Please remember to go to the lab department   for your tests - we will call you with the results when they are  available.  Please remember to go to the  x-ray department  @  [facility] for your tests - we will call you with the results when they are available     Please schedule a follow up office visit in 2 weeks, sooner if needed  with all medications /inhalers/ solutions in hand so we can verify exactly what you are taking. This includes all medications from all doctors and over the counters

## 2023-12-05 LAB — CBC WITH DIFFERENTIAL/PLATELET
Basophils Absolute: 0 10*3/uL (ref 0.0–0.2)
Basos: 0 %
EOS (ABSOLUTE): 0 10*3/uL (ref 0.0–0.4)
Eos: 0 %
Hematocrit: 37.5 % (ref 34.0–46.6)
Hemoglobin: 12.1 g/dL (ref 11.1–15.9)
Immature Grans (Abs): 0 10*3/uL (ref 0.0–0.1)
Immature Granulocytes: 0 %
Lymphocytes Absolute: 1.6 10*3/uL (ref 0.7–3.1)
Lymphs: 34 %
MCH: 28.1 pg (ref 26.6–33.0)
MCHC: 32.3 g/dL (ref 31.5–35.7)
MCV: 87 fL (ref 79–97)
Monocytes Absolute: 0.4 10*3/uL (ref 0.1–0.9)
Monocytes: 8 %
Neutrophils Absolute: 2.8 10*3/uL (ref 1.4–7.0)
Neutrophils: 58 %
Platelets: 273 10*3/uL (ref 150–450)
RBC: 4.31 x10E6/uL (ref 3.77–5.28)
RDW: 13.4 % (ref 11.7–15.4)
WBC: 4.8 10*3/uL (ref 3.4–10.8)

## 2023-12-05 LAB — IGE: IgE (Immunoglobulin E), Serum: 28 [IU]/mL (ref 6–495)

## 2023-12-11 ENCOUNTER — Telehealth: Payer: Self-pay | Admitting: Internal Medicine

## 2023-12-11 NOTE — Telephone Encounter (Signed)
 FYI forwarding

## 2023-12-11 NOTE — Telephone Encounter (Signed)
 Patient wanted Dr. Sherene Sires to know the medication he prescribed for her worked very well and she is feeling so much better---patient call back [phone]

## 2023-12-15 ENCOUNTER — Ambulatory Visit: Payer: 59 | Admitting: Internal Medicine

## 2024-01-03 ENCOUNTER — Telehealth: Payer: Self-pay | Admitting: Internal Medicine

## 2024-01-03 NOTE — Telephone Encounter (Signed)
 Patient did not complete the DG Chest 2 view requested on 12/01/23    Please advise

## 2024-09-17 ENCOUNTER — Other Ambulatory Visit: Payer: Self-pay | Admitting: Internal Medicine

## 2024-09-17 DIAGNOSIS — R058 Other specified cough: Secondary | ICD-10-CM
